# Patient Record
Sex: Female | Born: 2000 | Race: Black or African American | Hispanic: No | Marital: Single | Smoking: Never smoker
Health system: Southern US, Community
[De-identification: ages and names within clinical notes are randomized; demographics above are authoritative.]

## PROBLEM LIST (undated history)

## (undated) DIAGNOSIS — L709 Acne, unspecified: Secondary | ICD-10-CM

---
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
## 2020-01-19 ENCOUNTER — Other Ambulatory Visit: Payer: Self-pay | Admitting: Pediatrics

## 2020-01-19 DIAGNOSIS — N6310 Unspecified lump in the right breast, unspecified quadrant: Secondary | ICD-10-CM

## 2020-02-06 ENCOUNTER — Other Ambulatory Visit: Payer: No Typology Code available for payment source

## 2020-02-28 ENCOUNTER — Other Ambulatory Visit: Payer: Self-pay

## 2020-02-28 ENCOUNTER — Ambulatory Visit
Admission: RE | Admit: 2020-02-28 | Discharge: 2020-02-28 | Disposition: A | Discharge status: Home / Self Care | Payer: No Typology Code available for payment source | Source: Ambulatory Visit | Attending: Pediatrics | Admitting: Pediatrics

## 2020-02-28 DIAGNOSIS — N6310 Unspecified lump in the right breast, unspecified quadrant: Secondary | ICD-10-CM

## 2020-03-17 IMAGING — US ULTRASOUND RIGHT BREAST LIMITED
1 series · 13 of 14 positions shown · non-contrast
Comparison: Previous exam(s).

CLINICAL DATA: Follow-up for 3 probably benign fibroadenomas in the
LEFT breast. These masses were originally identified on LEFT breast
ultrasound dated 01/29/2008 (patient had 2 palpable abnormalities at
that time).

EXAM:
ULTRASOUND OF THE LEFT BREAST

[Series 1: ultrasound right breast limited · 0.07mm/px · 13 of 14 slices shown]
[im 1/14]
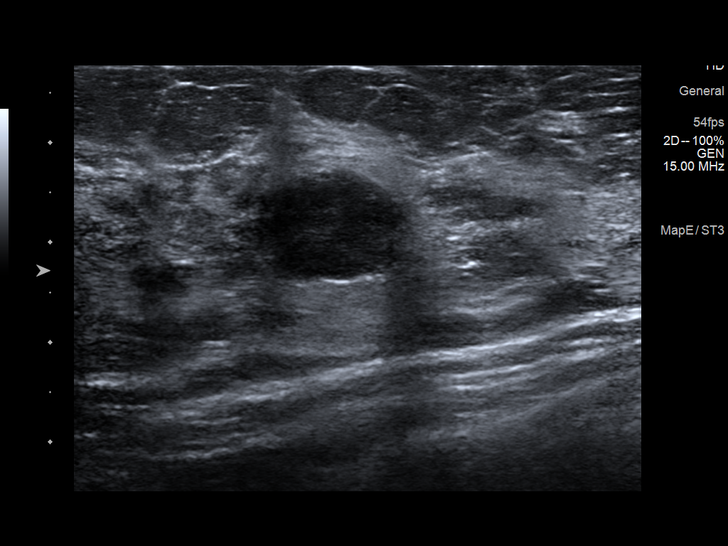
[im 2/14]
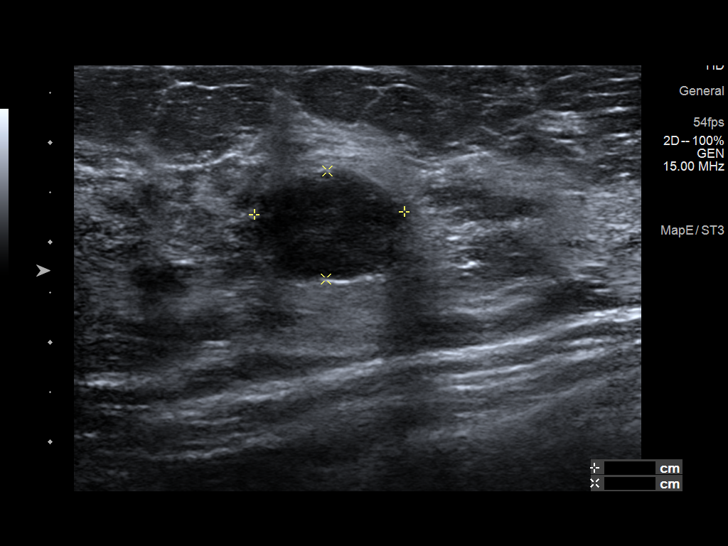
[im 3/14]
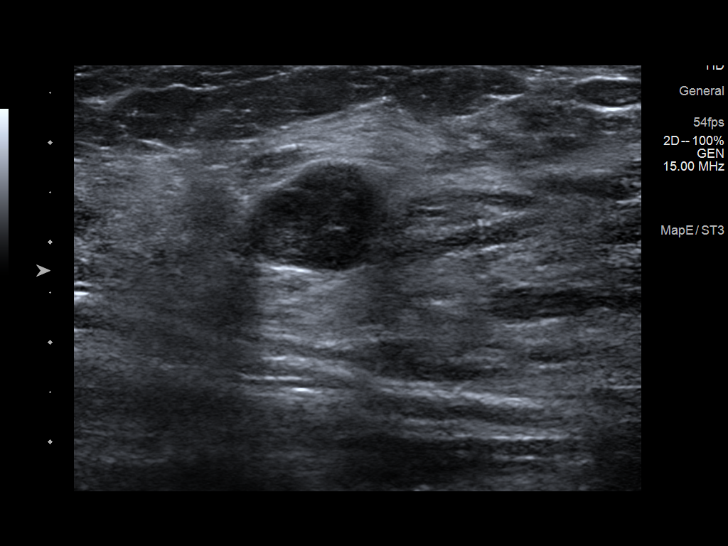
[im 4/14]
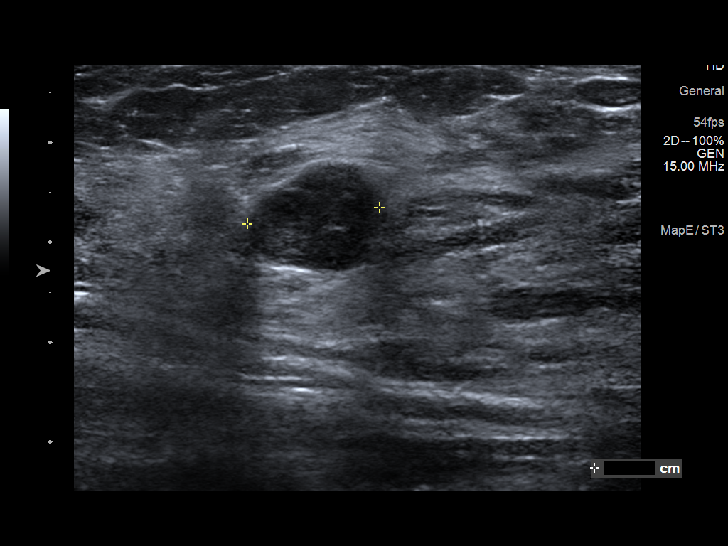
[im 5/14]
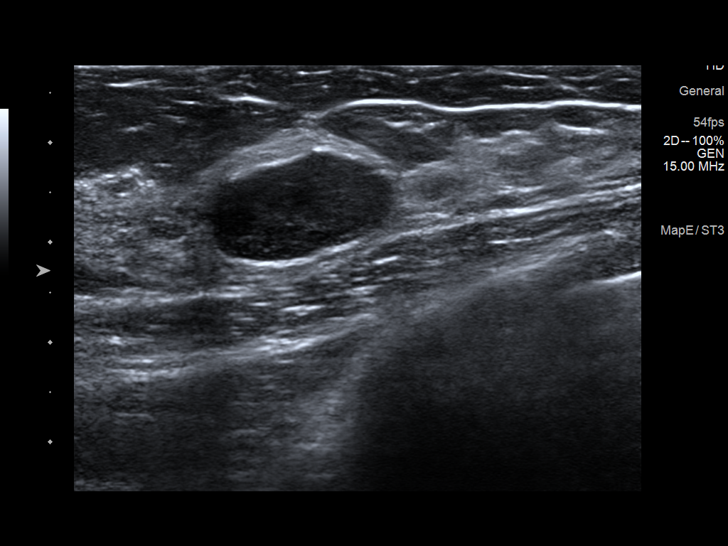
[im 6/14]
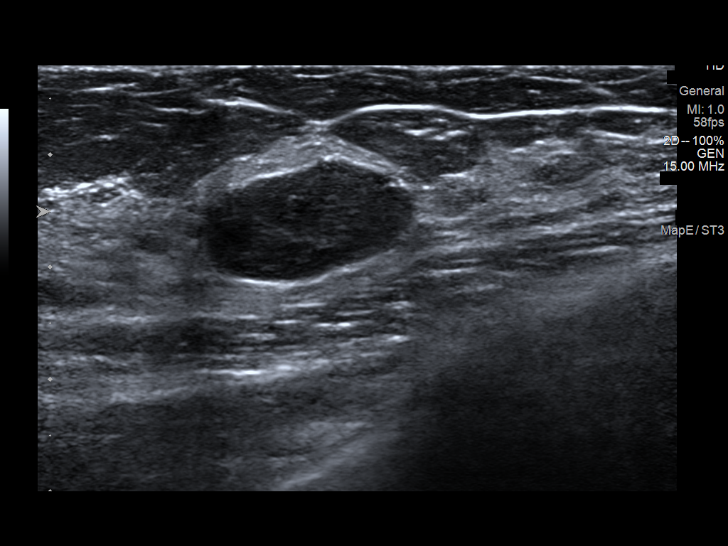
[im 8/14]
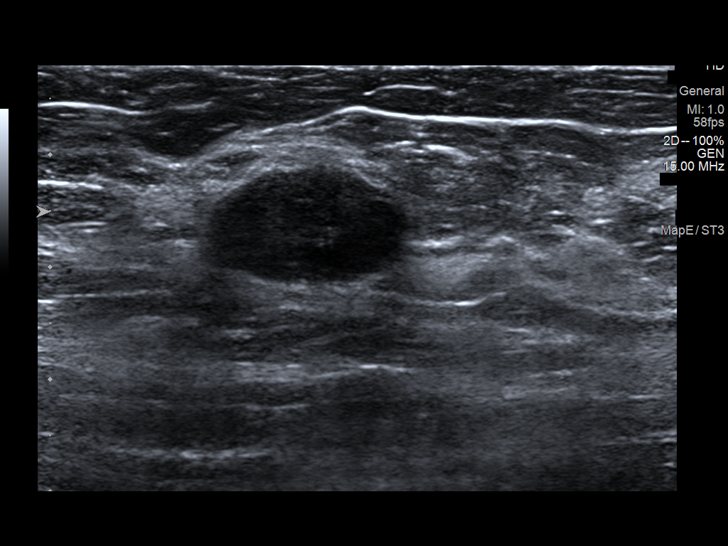
[im 9/14]
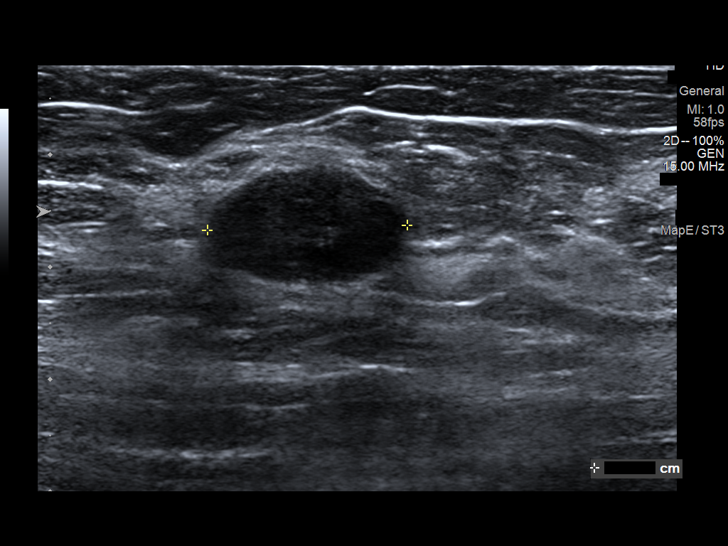
[im 10/14]
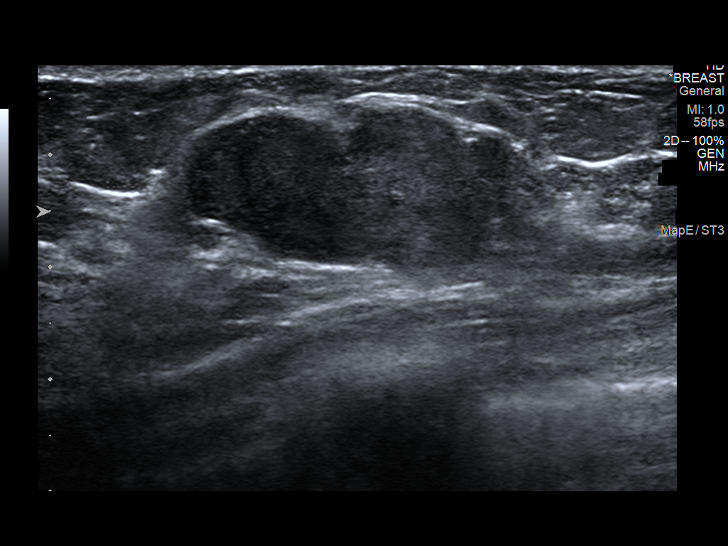
[im 11/14]
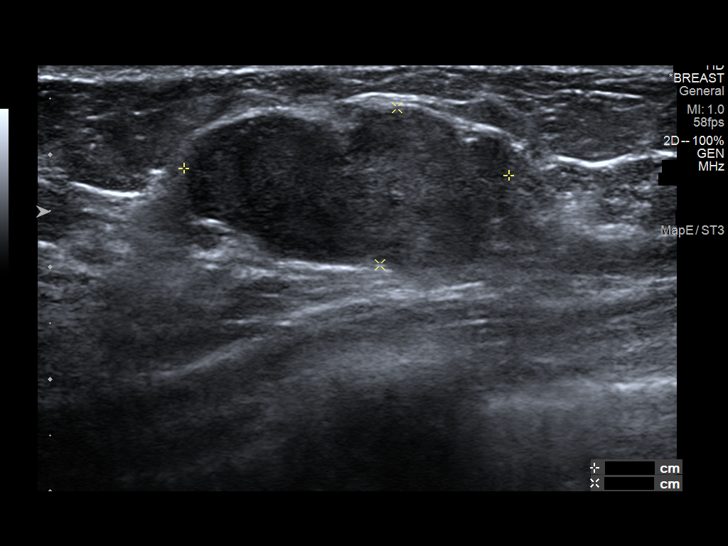
[im 12/14]
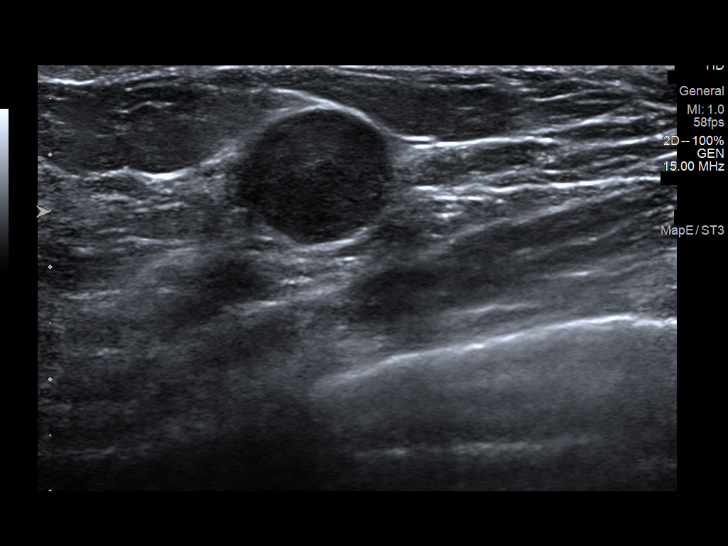
[im 13/14]
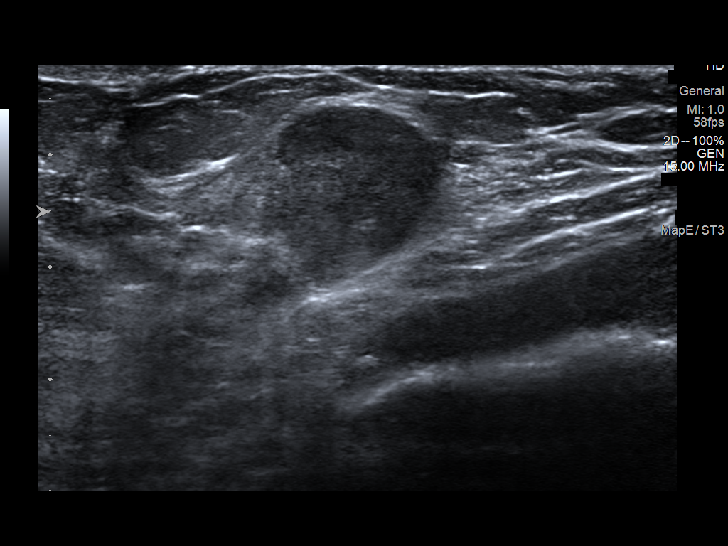
[im 14/14]
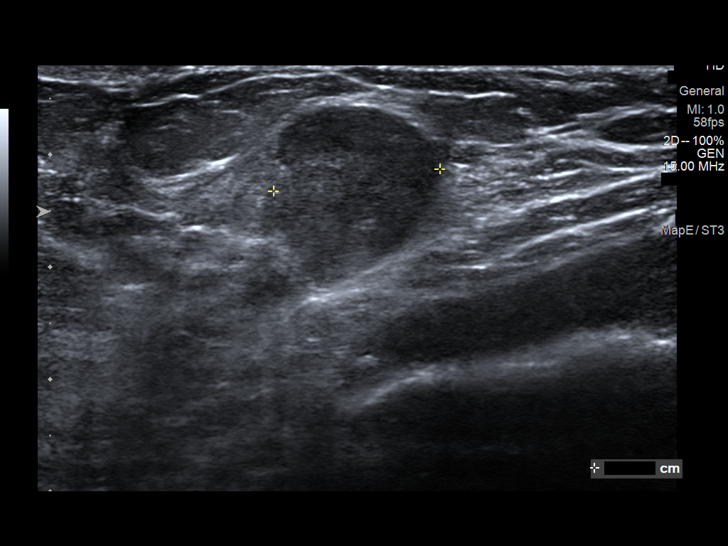

[13 of 14 positions shown; findings below may reference images not displayed]

FINDINGS: Targeted ultrasound is performed, showing 3 oval circumscribed
hypoechoic masses in the RIGHT breast as follows:

12 o'clock axis, 5 cm from the nipple, measuring 1.5 x 1.1 x 1.3 cm,
slightly smaller compared to previous ultrasound of 01/28/2018.

12:30 o'clock axis, 7 cm from the nipple, measuring 1.8 x 0.9 x
cm, not significantly changed.

2 o'clock axis, 7 cm from the nipple, measuring 2.9 x 1.4 x 1.5 cm,
minimally enlarged.
IMPRESSION: Three probably benign fibroadenomas within the RIGHT breast, as
detailed above. Recommend additional follow-up ultrasound in 6
months to ensure stability.

RECOMMENDATION:
RIGHT breast ultrasound in 3 months.

I have discussed the findings and recommendations with the patient.
Results were also provided in writing at the conclusion of the
visit. If applicable, a reminder letter will be sent to the patient
regarding the next appointment.

BI-RADS CATEGORY  3: Probably benign.

## 2020-08-29 ENCOUNTER — Ambulatory Visit: Payer: Medicaid Other | Attending: Internal Medicine

## 2020-08-29 DIAGNOSIS — Z23 Encounter for immunization: Secondary | ICD-10-CM

## 2020-08-29 NOTE — Progress Notes (Signed)
   Covid-19 Vaccination Clinic  Name:  Jillian Kelley    MRN: [id number] DOB: 03-Oct-2000  08/29/2020  [name] was observed post Covid-19 immunization for 15 minutes without incident. She was provided with Vaccine Information Sheet and instruction to access the V-Safe system.   [name] was instructed to call 911 with any severe reactions post vaccine: Marland Kitchen Difficulty breathing  . Swelling of face and throat  . A fast heartbeat  . A bad rash all over body  . Dizziness and weakness   Immunizations Administered    Name Date Dose VIS Date Route   Pfizer COVID-19 Vaccine 08/29/2020  2:15 PM 0.3 mL 06/19/2020 Intramuscular   Manufacturer: [employer], [employer]   Lot: [id number]   NDC: [address]-6

## 2020-09-16 IMAGING — US ULTRASOUND RIGHT BREAST LIMITED
1 series · 13 of 13 positions shown · non-contrast
Comparison: Previous exam(s).

CLINICAL DATA: Short-term follow-up for 3 masses in the right
breast, initially assessed on 01/28/2018.

EXAM:
ULTRASOUND OF THE RIGHT BREAST

[Series 1: ultrasound right breast limited · 0.07mm/px · 13 of 13 slices shown]
[im 1/13]
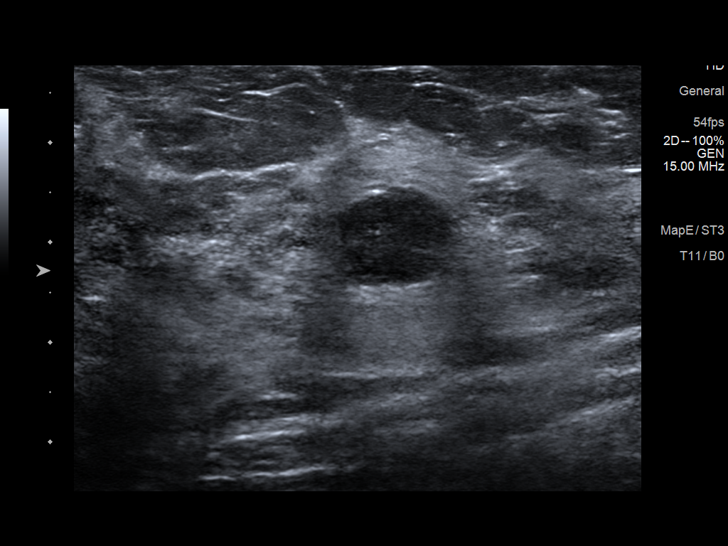
[im 2/13]
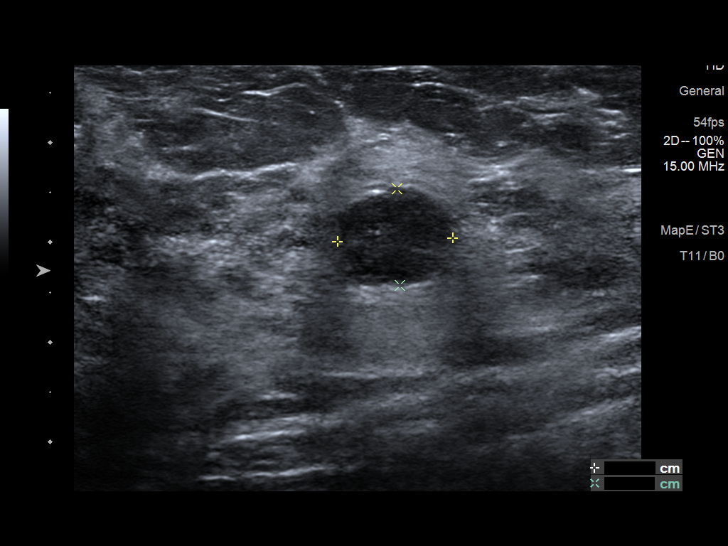
[im 3/13]
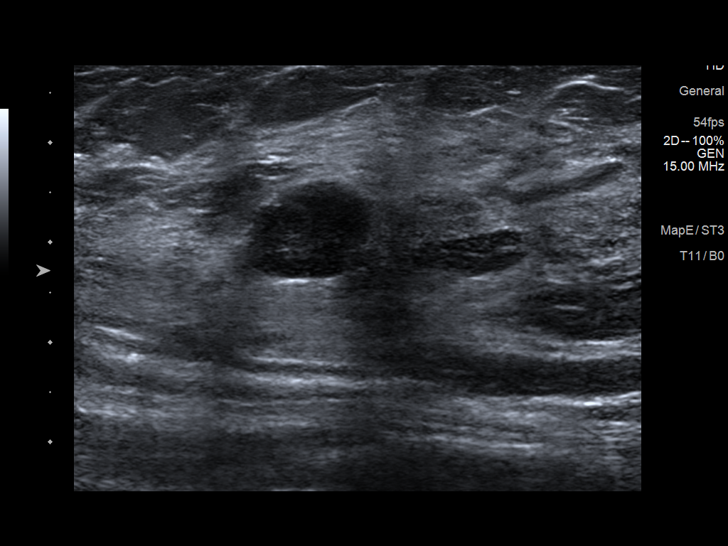
[im 4/13]
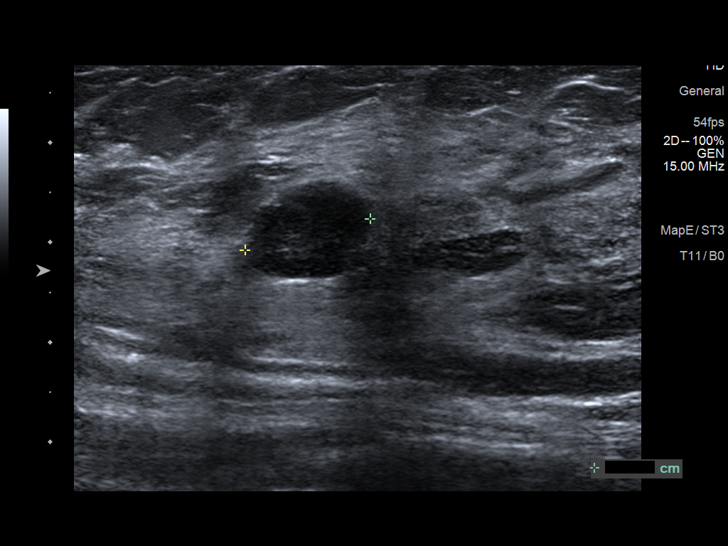
[im 5/13]
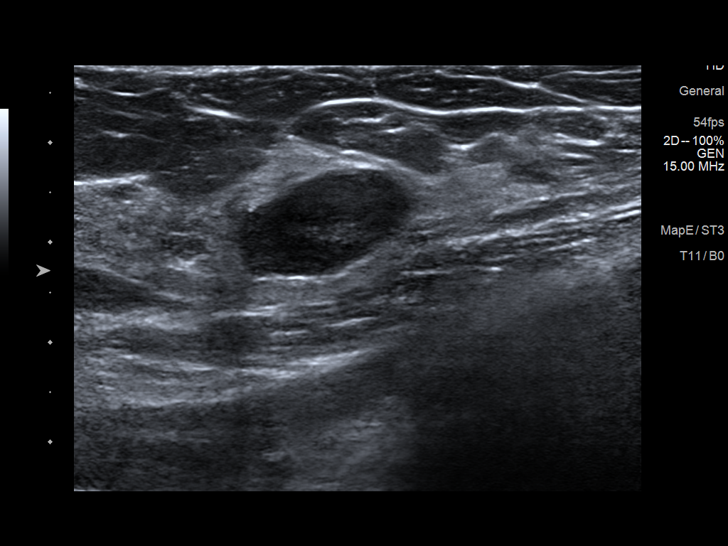
[im 6/13]
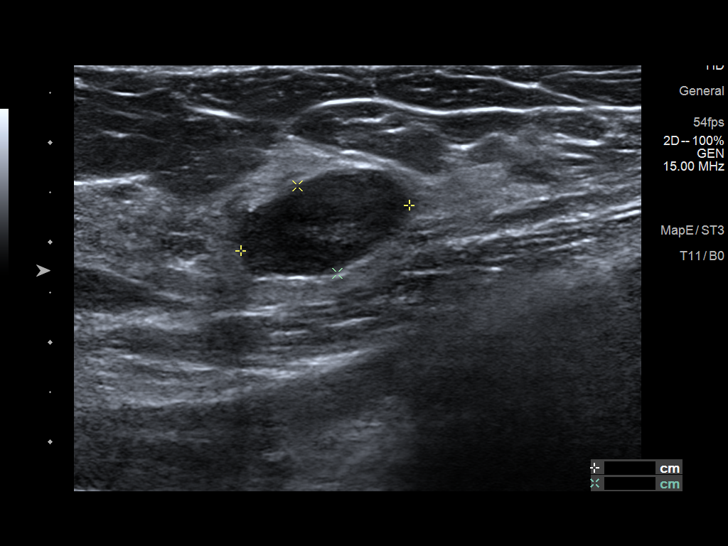
[im 7/13]
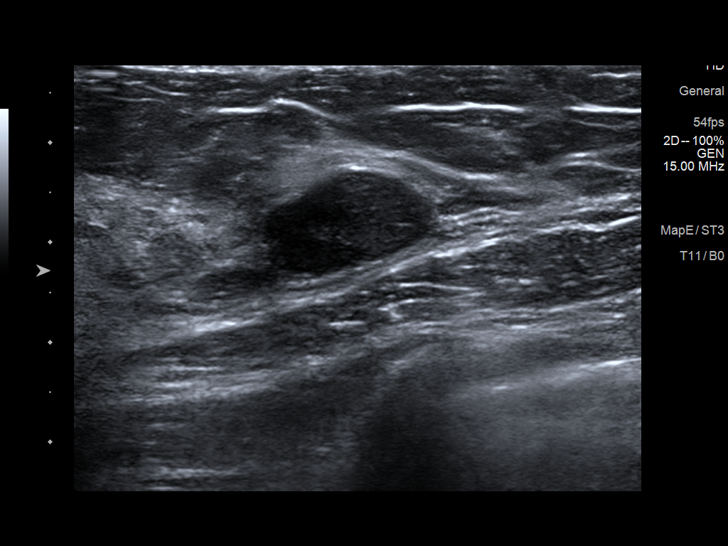
[im 8/13]
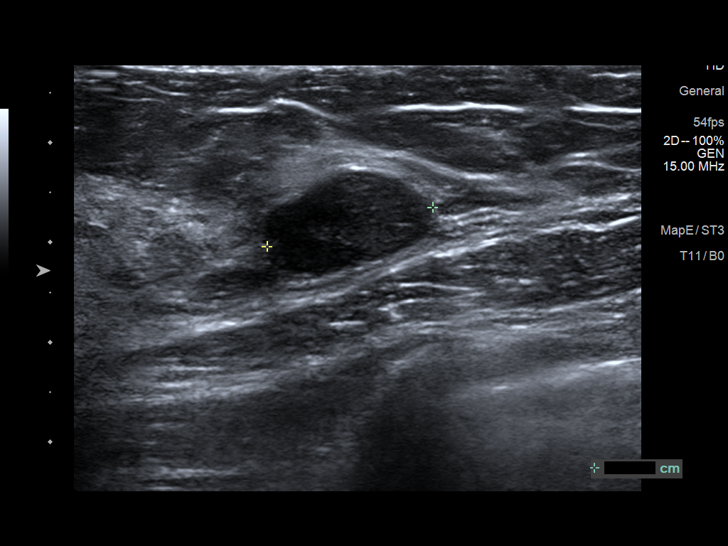
[im 9/13]
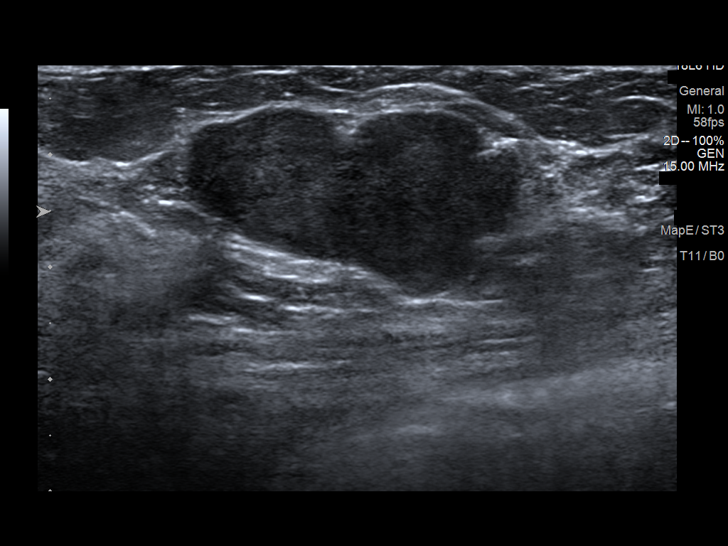
[im 10/13]
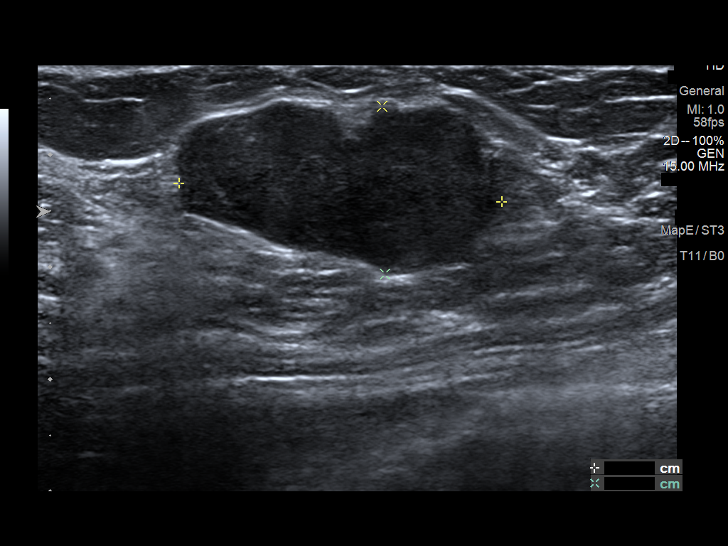
[im 11/13]
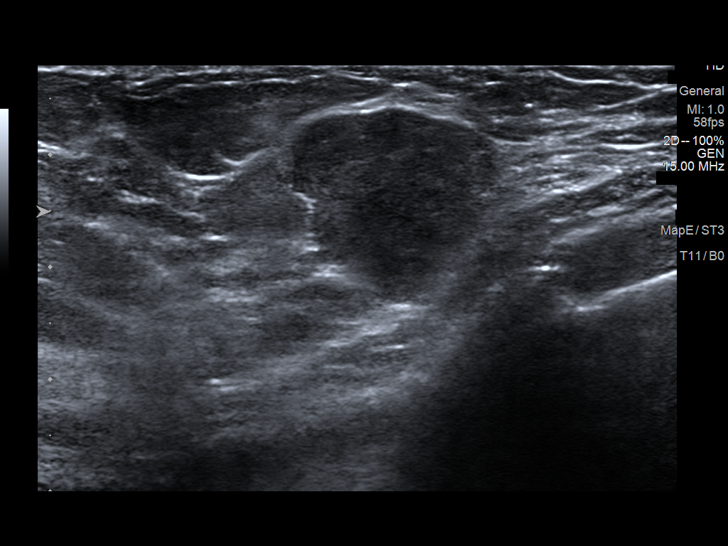
[im 12/13]
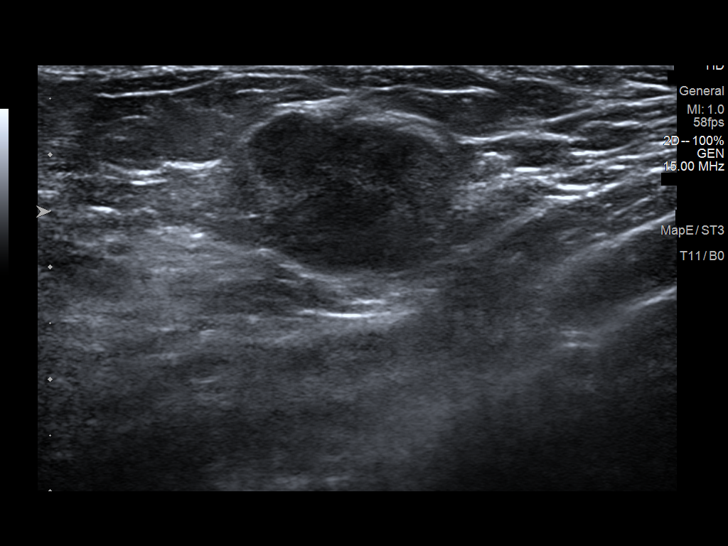
[im 13/13]
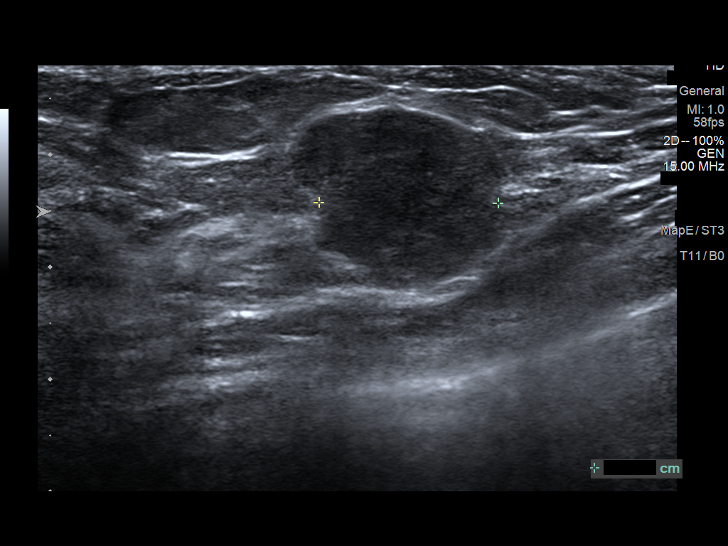

[13 of 13 positions shown; findings below may reference images not displayed]

FINDINGS: Targeted ultrasound is performed, showing a mass at 2 o'clock, 7 cm
the nipple, measuring 2.9 x 1.5 x 1.6 cm, a mass at 12:30 o'clock, 7
cm the nipple, measuring 1.8 x 1.0 x 1.7 cm and a mass at 12
o'clock, 5 cm the nipple, measuring 1.3 x 1.0 x 1.2 cm. The mass at
where it measured 1.7 x 1.2 x 1.5 cm. There has been no other
significant change.
IMPRESSION: 1. Probably benign masses in the right breast, most likely all
fibroadenomas. There are 3 discrete masses, 1 at 12 o'clock, 5 cm
from the nipple showing a mild decrease in size compared to the
initial exam. There is been no other change. Recommend 1 additional
follow-up in 1 year to document 2 years of stability.

RECOMMENDATION:
Right breast ultrasound in 1 year to document 2 years of stability
for the 3 probably benign right breast masses.

I have discussed the findings and recommendations with the patient.
Results were also provided in writing at the conclusion of the
visit. If applicable, a reminder letter will be sent to the patient
regarding the next appointment.

BI-RADS CATEGORY  3: Probably benign.

## 2020-09-19 ENCOUNTER — Ambulatory Visit: Payer: Medicaid Other | Attending: Internal Medicine

## 2020-09-19 DIAGNOSIS — Z23 Encounter for immunization: Secondary | ICD-10-CM

## 2020-09-19 NOTE — Progress Notes (Signed)
   Covid-19 Vaccination Clinic  Name:  Jillian Kelley    MRN: [id number] DOB: 2001/06/16  09/19/2020  [name] was observed post Covid-19 immunization for 15 minutes without incident. She was provided with Vaccine Information Sheet and instruction to access the V-Safe system.   [name] was instructed to call 911 with any severe reactions post vaccine: Marland Kitchen Difficulty breathing  . Swelling of face and throat  . A fast heartbeat  . A bad rash all over body  . Dizziness and weakness   Immunizations Administered    Name Date Dose VIS Date Route   Pfizer COVID-19 Vaccine 09/19/2020  3:51 PM 0.3 mL 06/19/2020 Intramuscular   Manufacturer: [employer]   Lot: [id number]   NDC: [address]-0

## 2021-03-20 ENCOUNTER — Ambulatory Visit
Admission: RE | Admit: 2021-03-20 | Discharge: 2021-03-20 | Disposition: A | Discharge status: Home / Self Care | Payer: No Typology Code available for payment source | Source: Ambulatory Visit | Attending: Obstetrics and Gynecology | Admitting: Obstetrics and Gynecology

## 2021-03-20 ENCOUNTER — Other Ambulatory Visit: Payer: Self-pay | Admitting: Obstetrics and Gynecology

## 2021-03-20 DIAGNOSIS — R7611 Nonspecific reaction to tuberculin skin test without active tuberculosis: Secondary | ICD-10-CM

## 2021-04-28 ENCOUNTER — Ambulatory Visit: Payer: Medicaid Other | Admitting: Nurse Practitioner

## 2021-06-14 ENCOUNTER — Emergency Department (HOSPITAL_BASED_OUTPATIENT_CLINIC_OR_DEPARTMENT_OTHER)
Admission: EM | Admit: 2021-06-14 | Discharge: 2021-06-14 | Disposition: A | Discharge status: Home / Self Care | Payer: 59 | Attending: Emergency Medicine | Admitting: Emergency Medicine

## 2021-06-14 ENCOUNTER — Other Ambulatory Visit: Payer: Self-pay

## 2021-06-14 ENCOUNTER — Encounter (HOSPITAL_BASED_OUTPATIENT_CLINIC_OR_DEPARTMENT_OTHER): Payer: Self-pay | Admitting: Emergency Medicine

## 2021-06-14 DIAGNOSIS — Y990 Civilian activity done for income or pay: Secondary | ICD-10-CM | POA: Diagnosis not present

## 2021-06-14 DIAGNOSIS — S61213A Laceration without foreign body of left middle finger without damage to nail, initial encounter: Secondary | ICD-10-CM | POA: Insufficient documentation

## 2021-06-14 DIAGNOSIS — W272XXA Contact with scissors, initial encounter: Secondary | ICD-10-CM | POA: Diagnosis not present

## 2021-06-14 DIAGNOSIS — S6992XA Unspecified injury of left wrist, hand and finger(s), initial encounter: Secondary | ICD-10-CM | POA: Diagnosis present

## 2021-06-14 HISTORY — DX: Acne, unspecified: L70.9

## 2021-06-14 MED ORDER — LIDOCAINE-EPINEPHRINE (PF) 2 %-1:200000 IJ SOLN
INTRAMUSCULAR | Status: AC
  Administered 2021-06-14: 20 mL
  Filled 2021-06-14: qty 20

## 2021-06-14 NOTE — ED Triage Notes (Signed)
Reports she cut her left middle finger with scissors while taking her hair out.  Reports tetanus is up to date.  Bleeding controlled in triage.

## 2021-06-14 NOTE — Discharge Instructions (Addendum)
Have the sutures removed in 7 to 10 days.  You may return here or see your primary care physician or an urgent care.

## 2021-06-14 NOTE — ED Provider Notes (Signed)
[facility] Provider Note: Georgena Spurling, MD, FACEP  CSN: [id number] MRN: [id number] ARRIVAL: 06/14/21 at 2205 ROOM: [facility]13/[facility]13   CHIEF COMPLAINT  Laceration   HISTORY OF PRESENT ILLNESS  06/14/21 10:51 PM Jillian Kelley is a 20 y.o. female who cut the volar surface of her left hand, overlying the middle finger MCP joint, about 8:30 PM.  She did this while working on her hair with a pair of scissors.  There is a small laceration at the site.  There is negligible pain.  Bleeding has been controlled with pressure.  There is no functional sensory deficit associated.  Her tetanus is up-to-date.   Past Medical History:  Diagnosis Date   Acne     History reviewed. No pertinent surgical history.  Family History  Problem Relation Age of Onset   Breast cancer Maternal Grandmother 41   Liver cancer Father     Social History   Tobacco Use   Smoking status: Never   Smokeless tobacco: Never  Substance Use Topics   Alcohol use: No   Drug use: No    Prior to Admission medications   Not on File    Allergies Patient has no known allergies.   REVIEW OF SYSTEMS  Negative except as noted here or in the History of Present Illness.   PHYSICAL EXAMINATION  Initial Vital Signs Blood pressure 135/88, pulse 75, temperature 98.1 F (36.7 C), temperature source Oral, resp. rate 18, height 5\' 4"  (1.626 m), weight 75.8 kg, last menstrual period 05/30/2021, SpO2 99 %.  Examination General: Well-developed, well-nourished female in no acute distress; appearance consistent with age of record HENT: normocephalic; atraumatic Eyes: Normal appearance Neck: supple Heart: regular rate and rhythm Lungs: Normal respiratory effort and excursion Abdomen: soft; nondistended Extremities: No deformity; normal range of motion Neurologic: Awake, alert and oriented; motor function intact in all extremities and symmetric; no facial droop Skin: Warm and dry; superficial laceration to skin  of the volar surface of the left hand overlying the third MCP joint Psychiatric: Normal mood and affect   RESULTS  Summary of this visit's results, reviewed and interpreted by myself:   EKG Interpretation  Date/Time:    Ventricular Rate:    PR Interval:    QRS Duration:   QT Interval:    QTC Calculation:   R Axis:     Text Interpretation:         Laboratory Studies: No results found for this or any previous visit (from the past 24 hour(s)). Imaging Studies: No results found.  ED COURSE and MDM  Nursing notes, initial and subsequent vitals signs, including pulse oximetry, reviewed and interpreted by myself.  Vitals:   06/14/21 2214 06/14/21 2214  BP:  135/88  Pulse:  75  Resp:  18  Temp:  98.1 F (36.7 C)  TempSrc:  Oral  SpO2:  99%  Weight: 75.8 kg   Height: 5\' 4"  (1.626 m)    Medications  lidocaine-EPINEPHrine (XYLOCAINE W/EPI) 2 %-1:200000 (PF) injection (has no administration in time range)    Wound closed as described below.  The wound is superficial and clean and I do not believe antibiotics are warranted at this time.  Patient was advised to have the stitches removed in 7 to 10 days.  PROCEDURES  Procedures LACERATION REPAIR Performed by: Karen Chafe Kenidy Crossland Authorized by: Karen Chafe Cimberly Stoffel Consent: Verbal consent obtained. Risks and benefits: risks, benefits and alternatives were discussed Consent given by: patient Patient identity confirmed: provided demographic data  Prepped and Draped in normal sterile fashion Wound explored  Laceration Location: Left hand  Laceration Length: 0.8 cm  No Foreign Bodies seen or palpated  Anesthesia: local infiltration  Local anesthetic: lidocaine 2% with epinephrine  Anesthetic total: 0.5 ml  Irrigation method: syringe Amount of cleaning: standard  Skin closure: 4-0 Prolene  Number of sutures: 2  Technique: Simple interrupted  Patient tolerance: Patient tolerated the procedure well with no immediate  complications.    ED DIAGNOSES     ICD-10-CM   1. Laceration of left middle finger without foreign body without damage to nail, initial encounter  R30.856D          Shanon Rosser, MD 06/14/21 2318

## 2021-06-14 NOTE — ED Notes (Signed)
Pt NAD, a/ox4. Pt verbalizes understanding of all DC and f/u instructions. All questions answered. Pt walks with steady gait to lobby at DC.  ? ?

## 2021-10-19 IMAGING — US US BREAST*R* LIMITED INC AXILLA
1 series · 13 of 13 positions shown · non-contrast
Comparison: Previous exam(s).

CLINICAL DATA: 19-year-old female presenting for final 2 year
follow-up of 3 right breast masses.

EXAM:
ULTRASOUND OF THE RIGHT BREAST

[Series 1: us breast*right* limited inc axilla · 0.07mm/px · 13 of 13 slices shown]
[im 1/13]
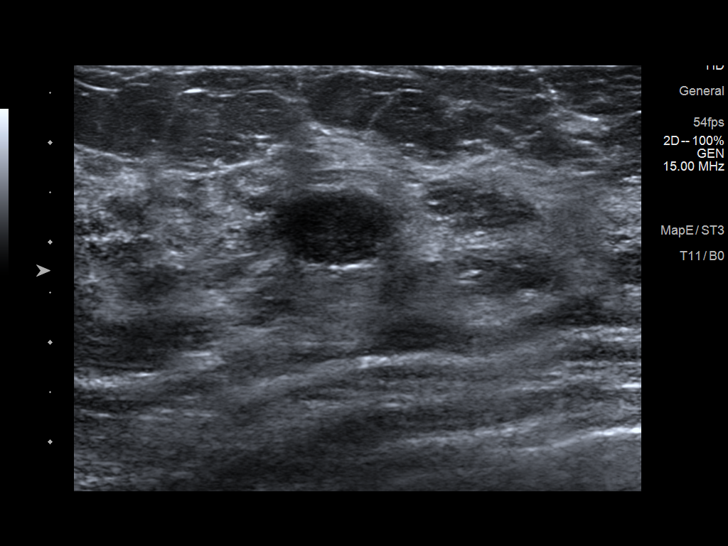
[im 2/13]
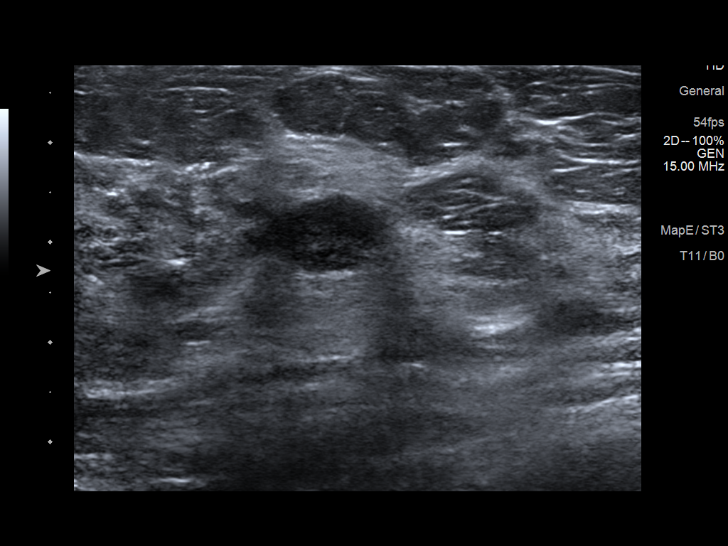
[im 3/13]
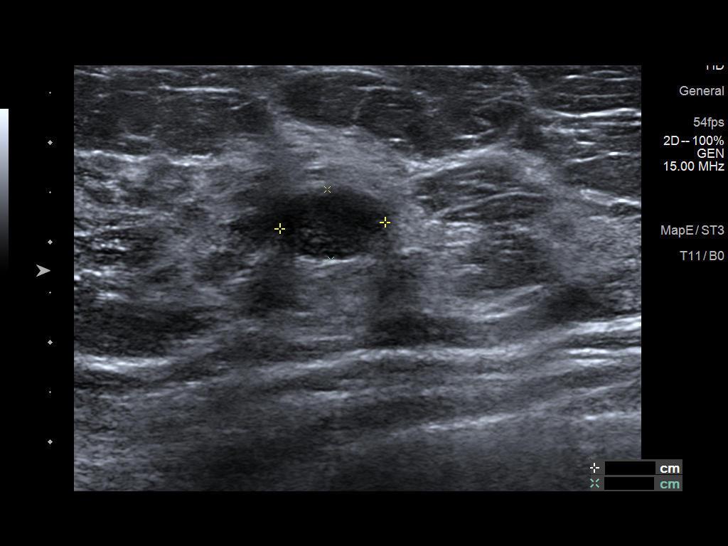
[im 4/13]
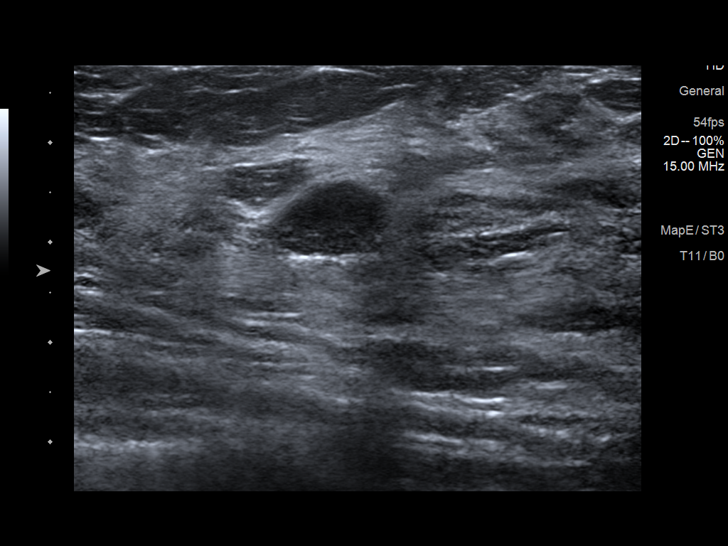
[im 5/13]
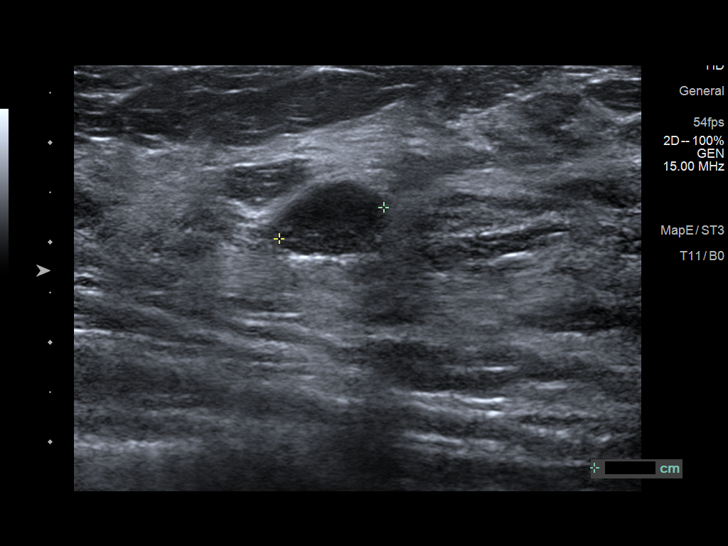
[im 6/13]
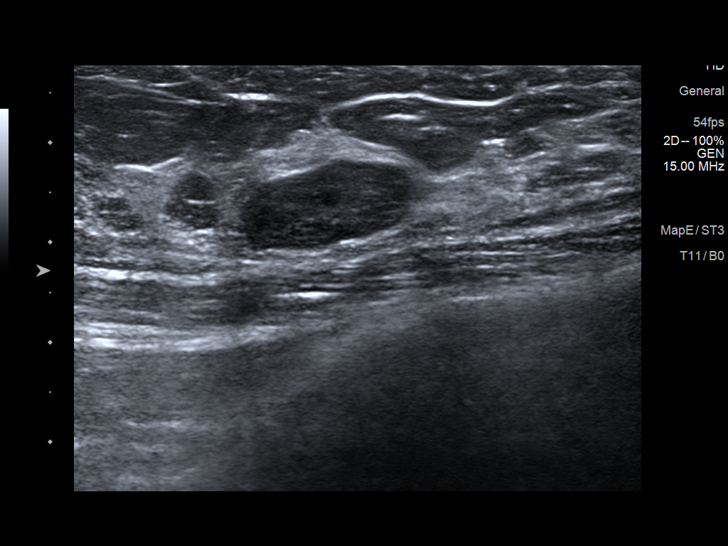
[im 7/13]
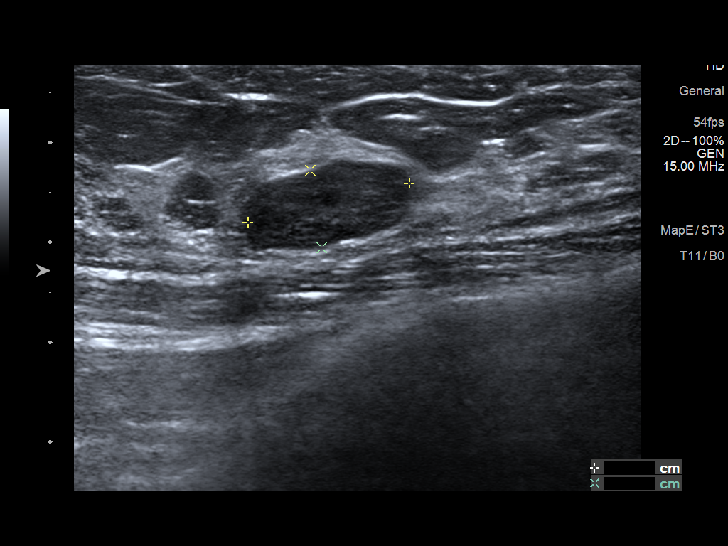
[im 8/13]
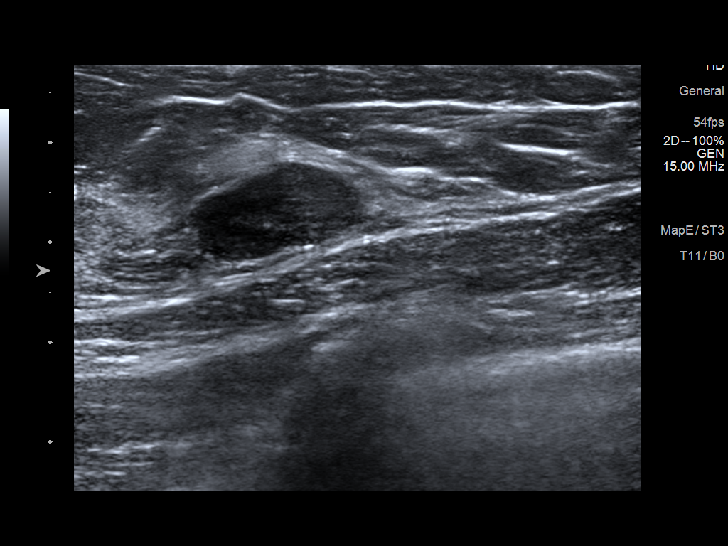
[im 9/13]
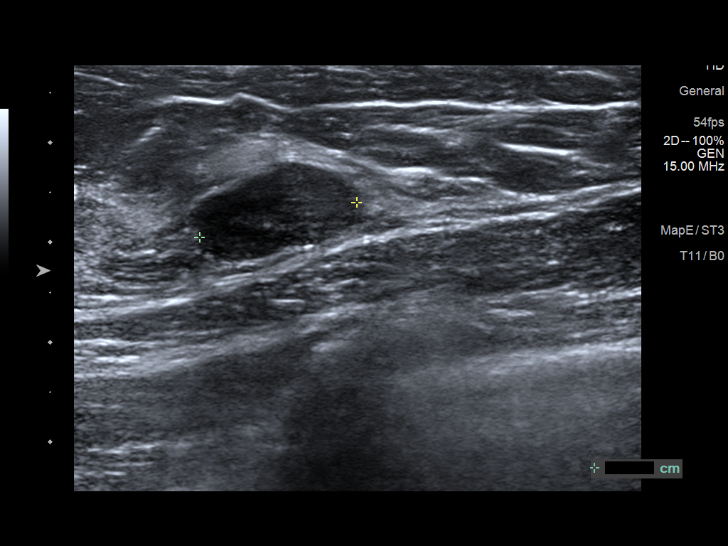
[im 10/13]
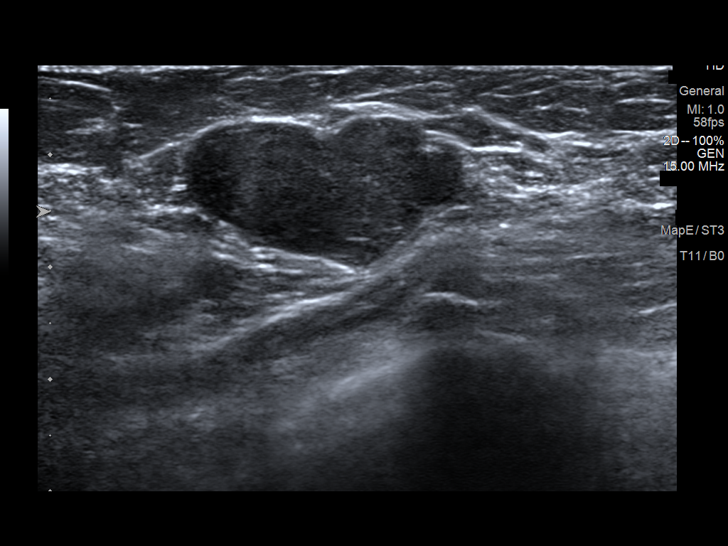
[im 11/13]
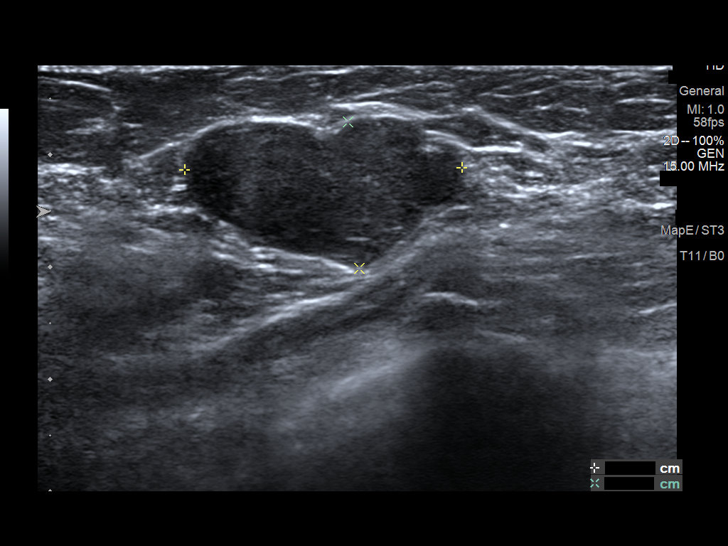
[im 12/13]
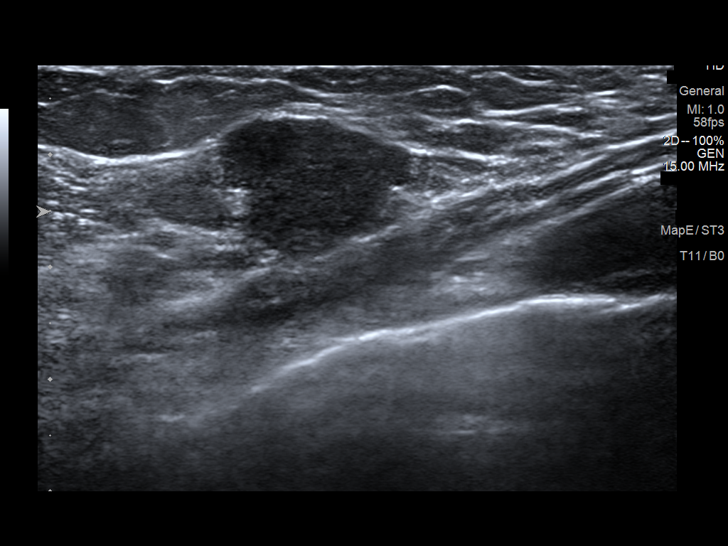
[im 13/13]
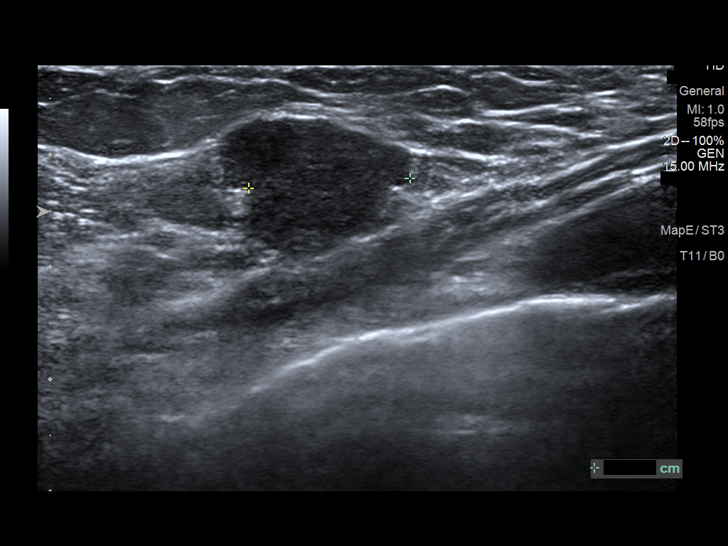

[13 of 13 positions shown; findings below may reference images not displayed]

FINDINGS: Targeted ultrasound is performed, showing stable to slight interval
decrease in size of a 3 oval, circumscribed hypoechoic masses in the
right breast. This includes a 1.1 x 1.1 x 0.7 cm mass at the 12
o'clock position 5 cm from the nipple (previously 1.7 x 1.5 x
cm), a 1.7 x 1.6 x 1.8 cm mass at the [DATE] position 7 cm from the
nipple (previously 2.0 x 1.9 x 1.0 cm), and a 2.5 x 1.4 x 1.3 cm
mass at the 2 o'clock position 7 cm from the nipple (previously
x 1.4 x 1.2 cm).
IMPRESSION: Three benign right breast masses. No further imaging follow-up
required.

RECOMMENDATION:
Screening mammogram at age 40 unless there are persistent or
intervening clinical concerns. (Code:[id number])

I have discussed the findings and recommendations with the patient.
If applicable, a reminder letter will be sent to the patient
regarding the next appointment.

BI-RADS CATEGORY  2: Benign.

## 2022-02-09 ENCOUNTER — Encounter: Payer: Self-pay | Admitting: Family

## 2022-02-09 ENCOUNTER — Ambulatory Visit (INDEPENDENT_AMBULATORY_CARE_PROVIDER_SITE_OTHER): Payer: 59 | Admitting: Family

## 2022-02-09 VITALS — BP 116/68 | HR 72 | Temp 98.6°F | Resp 16 | Ht 64.0 in | Wt 176.0 lb

## 2022-02-09 DIAGNOSIS — L709 Acne, unspecified: Secondary | ICD-10-CM | POA: Diagnosis not present

## 2022-02-09 DIAGNOSIS — Z0001 Encounter for general adult medical examination with abnormal findings: Secondary | ICD-10-CM | POA: Diagnosis not present

## 2022-02-09 DIAGNOSIS — E663 Overweight: Secondary | ICD-10-CM

## 2022-02-09 DIAGNOSIS — Z23 Encounter for immunization: Secondary | ICD-10-CM | POA: Diagnosis not present

## 2022-02-09 DIAGNOSIS — Z0000 Encounter for general adult medical examination without abnormal findings: Secondary | ICD-10-CM | POA: Insufficient documentation

## 2022-02-09 LAB — LIPID PANEL
Cholesterol: 151 mg/dL (ref 0–200)
HDL: 54.1 mg/dL (ref >39.00)
LDL Cholesterol: 90 mg/dL (ref 0–99)
NonHDL: 97.26
Total CHOL/HDL Ratio: 3
Triglycerides: 37 mg/dL (ref 0.0–149.0)
VLDL: 7.4 mg/dL (ref 0.0–40.0)

## 2022-02-09 MED ORDER — CLINDAMYCIN PHOS-BENZOYL PEROX 1-5 % EX GEL: Freq: Two times a day (BID) | CUTANEOUS | 5 refills | Status: DC

## 2022-02-09 NOTE — Assessment & Plan Note (Signed)
Mild acne on forehead. Rx sent for benzaclin. She will send me details of the retinol cream that she was using.

## 2022-02-09 NOTE — Assessment & Plan Note (Addendum)
Encouraged covid bivalent booster. Tdap today. She declines pap smear and wishes to schedule another day. We did discuss diet/exercise and weight loss.

## 2022-02-09 NOTE — Progress Notes (Addendum)
Subjective:   By signing my name below, I, Shehryar Baig, attest that this documentation has been prepared under the direction and in the presence of Debbrah Alar, NP 02/09/2022     Patient ID: Jillian Kelley, female    DOB: 03/16/2001, 21 y.o.   MRN: [id number]  No chief complaint on file.   HPI Patient is in today for a new patient visit.   Menstrual cycle- Her menstrual cycle is regular and typically last for 7 days. She reports her cramping was bad earlier in her life but now she just experiences discomfort.  Acne- She is managing her acne well at this time. She has taken retinol and benzaclin in the past and is interested in starting them again.   Social history: She is working a this time. 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 She is currently living with her mother and 2 brothers. She has a history of acne, 3 benign breast masses, otherwise she has no other medical history. She has no past surgical history. Her father passed away from liver cancer at age 75. Her father has a history of gallbladder removal. Her maternal grandmother has a history of breast cancer. She has no known family member with diabetes. She has 1 drink of alcohol weekly. She does not use drugs. She is not sexually active. She does not use tobacco or vaping products.  She denies unexpected weight gain, adenopathy, Fever, New moles, congestion, sinus pain, sore throat, chest pain, palpitaitons, leg swelling, shortness of breath, wheezing, cough Nausea, vomiting, diarrhea, consitpation, blood in stool, dysuria, hematuria, new joint pain, new muscle pain, frequent headaches, dizziness, depression, or anxiety.  Immunizations: She is due for a tetanus vaccine and is interested in receiving it. She has Covid-19 vaccines.  Diet: She is managing a healthy diet.  Exercise: She participates in exercise 3x every week by walking, StairMaster, and using the elliptical.  Pap Smear: She is  interested in scheduling an appointment at a later date.    Health Maintenance Due  Topic Date Due  . HPV VACCINES (1 - 2-dose series) Never done  . HIV Screening  Never done  . Hepatitis C Screening  Never done  . COVID-19 Vaccine (3 - Pfizer series) 11/14/2020  . PAP-Cervical Cytology Screening  01/31/2022  . PAP SMEAR-Modifier  01/31/2022    Past Medical History:  Diagnosis Date  . Acne     History reviewed. No pertinent surgical history.  Family History  Problem Relation Age of Onset  . Liver cancer Father        died at 58  . Alcohol abuse Father   . Breast cancer Maternal Grandmother 75    Social History   Socioeconomic History  . Marital status: Single    Spouse name: Not on file  . Number of children: Not on file  . Years of education: Not on file  . Highest education level: Not on file  Occupational History  . Not on file  Tobacco Use  . Smoking status: Never  . Smokeless tobacco: Never  Vaping Use  . Vaping Use: Never used  Substance and Sexual Activity  . Alcohol use: Yes    Alcohol/week: 1.0 standard drink of alcohol    Types: 1 Shots of liquor per week    Comment: per week  . Drug use: No  . Sexual activity: Never    Birth control/protection: None  Other Topics Concern  . 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     Social Determinants of Health   Financial Resource Strain: Not on file  Food Insecurity: No Food Insecurity (02/09/2022)   Hunger Vital Sign   . Worried About Charity fundraiser in the Last Year: Never true   . Ran Out of Food in the Last Year: Never true  Transportation Needs: No Transportation Needs (02/09/2022)   PRAPARE - Transportation   . Lack of Transportation (Medical): No   . Lack of Transportation (Non-Medical): No  Physical Activity: Insufficiently  Active (02/09/2022)   Exercise Vital Sign   . Days of Exercise per Week: 2 days   . Minutes of Exercise per Session: 60 min  Stress: Not on file  Social Connections: Not on file  Intimate Partner Violence: Not on file    No outpatient medications prior to visit.   No facility-administered medications prior to visit.    No Known Allergies  Review of Systems  Constitutional:  Negative for fever.       (-)unexpected weight change (-)Adenopathy  HENT:  Negative for congestion, hearing loss, sinus pain and sore throat.        (-)Rhinorrhea   Eyes:        (-)Visual disturbance  Respiratory:  Negative for cough, shortness of breath and wheezing.   Cardiovascular:  Negative for chest pain, palpitations and leg swelling.  Gastrointestinal:  Negative for blood in stool, constipation, diarrhea, nausea and vomiting.  Genitourinary:  Negative for dysuria, frequency and hematuria.  Musculoskeletal:  Negative for joint pain.       (-)new muscle pain  Skin:        (-)new moles  Neurological:  Negative for headaches.  Psychiatric/Behavioral:  Negative for depression. The patient is not nervous/anxious.        Objective:    Physical Exam Constitutional:      General: She is not in acute distress.    Appearance: Normal appearance. She is not ill-appearing.  HENT:     Head: Normocephalic and atraumatic.     Right Ear: Tympanic membrane, ear canal and external ear normal.     Left Ear: Tympanic membrane, ear canal and external ear normal.  Eyes:     Extraocular Movements: Extraocular movements intact.     Right eye: No nystagmus.     Left eye: No nystagmus.     Pupils: Pupils are equal, round, and reactive to light.  Neck:     Thyroid: No thyroid tenderness.  Cardiovascular:     Rate and Rhythm: Normal rate and regular rhythm.     Heart sounds: Normal heart sounds. No murmur heard.    No gallop.  Pulmonary:     Effort: Pulmonary effort is normal. No respiratory distress.      Breath sounds: Normal breath sounds. No wheezing or rales.  Abdominal:     General: There is no distension.     Palpations: Abdomen is soft.     Tenderness: There is no abdominal tenderness. There is no guarding.  Musculoskeletal:     Comments: 5/5 strength in both upper and lower extremities  Lymphadenopathy:     Cervical: No cervical adenopathy.  Skin:    General: Skin is warm and dry.  Neurological:     Mental Status:  She is alert and oriented to person, place, and time.     Deep Tendon Reflexes:     Reflex Scores:      Patellar reflexes are 2+ on the right side and 2+ on the left side. Psychiatric:        Judgment: Judgment normal.    BP 116/68 (BP Location: Right Arm, Patient Position: Sitting, Cuff Size: Small)   Pulse 72   Temp 98.6 F (37 C) (Oral)   Resp 16   Ht '5\' 4"'$  (1.626 m)   Wt 176 lb (79.8 kg)   SpO2 100%   BMI 30.21 kg/m  Wt Readings from Last 3 Encounters:  02/09/22 176 lb (79.8 kg)  06/14/21 167 lb (75.8 kg)  04/04/18 165 lb (74.8 kg) (92 %, Z= 1.44)*   * Growth percentiles are based on CDC (Girls, 2-20 Years) data.       Assessment & Plan:   Problem List Items Addressed This Visit       Unprioritized   Encounter for preventive care - Primary    Encouraged covid bivalent booster. Tdap today. She declines pap smear and wishes to schedule another day. We did discuss diet/exercise and weight loss.       Acne    Mild acne on forehead. Rx sent for benzaclin. She will send me details of the retinol cream that she was using.       Relevant Medications   clindamycin-benzoyl peroxide (BENZACLIN) gel   Other Visit Diagnoses     Overweight       Relevant Orders   Lipid panel   Need for Tdap vaccination       Relevant Orders   Tdap vaccine greater than or equal to 7yo IM (Completed)        Meds ordered this encounter  Medications  . clindamycin-benzoyl peroxide (BENZACLIN) gel    Sig: Apply topically 2 (two) times daily.    Dispense:  25  g    Refill:  5    Order Specific Question:   Supervising Provider    Answer:   Penni Homans A [[id number]]    I, Nance Pear, NP, personally preformed the services described in this documentation.  All medical record entries made by the scribe were at my direction and in my presence.  I have reviewed the chart and discharge instructions (if applicable) and agree that the record reflects my personal performance and is accurate and complete. 02/09/2022   I,Shehryar Baig,acting as a Education administrator for Nance Pear, NP.,have documented all relevant documentation on the behalf of Nance Pear, NP,as directed by  Nance Pear, NP while in the presence of Nance Pear, NP.   Nance Pear, NP

## 2022-03-10 ENCOUNTER — Encounter: Payer: Self-pay | Admitting: Family

## 2022-03-10 ENCOUNTER — Other Ambulatory Visit (HOSPITAL_COMMUNITY)
Admission: RE | Admit: 2022-03-10 | Discharge: 2022-03-10 | Disposition: A | Discharge status: Home / Self Care | Payer: 59 | Source: Ambulatory Visit | Attending: Family | Admitting: Family

## 2022-03-10 ENCOUNTER — Ambulatory Visit (INDEPENDENT_AMBULATORY_CARE_PROVIDER_SITE_OTHER): Payer: 59 | Admitting: Family

## 2022-03-10 VITALS — BP 110/78 | HR 75 | Temp 99.0°F | Resp 16 | Ht 64.0 in | Wt 175.0 lb

## 2022-03-10 DIAGNOSIS — Z01419 Encounter for gynecological examination (general) (routine) without abnormal findings: Secondary | ICD-10-CM | POA: Insufficient documentation

## 2022-03-10 DIAGNOSIS — Z111 Encounter for screening for respiratory tuberculosis: Secondary | ICD-10-CM | POA: Diagnosis not present

## 2022-03-10 DIAGNOSIS — L709 Acne, unspecified: Secondary | ICD-10-CM | POA: Diagnosis not present

## 2022-03-10 NOTE — Assessment & Plan Note (Signed)
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

## 2022-03-10 NOTE — Progress Notes (Signed)
Subjective:   By signing my name below, I, Kellie Simmering, attest that this documentation has been prepared under the direction and in the presence of Debbrah Alar, NP 03/10/2022.   Patient ID: Jillian Kelley, female    DOB: 08/04/2001, 21 y.o.   MRN: [id number]  Chief Complaint  Patient presents with   Gynecologic Exam    HPI Patient is in today for GYN exam/pap smear. This is her first pap and she denies any gyn concerns such as abnormal discharge. She is not sexually active and denies previous history of sexual activity.    Past Medical History:  Diagnosis Date   Acne     No past surgical history on file.  Family History  Problem Relation Age of Onset   Liver cancer Father        died at 58   Alcohol abuse Father    Breast cancer Maternal Grandmother 53    Social History   Socioeconomic History   Marital status: Single    Spouse name: Not on file   Number of children: Not on file   Years of education: Not on file   Highest education level: Not on file  Occupational History   Not on file  Tobacco Use   Smoking status: Never   Smokeless tobacco: Never  Vaping Use   Vaping Use: Never used  Substance and Sexual Activity   Alcohol use: Yes    Alcohol/week: 1.0 standard drink of alcohol    Types: 1 Shots of liquor per week    Comment: per week   Drug use: No   Sexual activity: Never    Birth control/protection: None  Other Topics Concern   Not on file  Social History Narrative   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     Social Determinants of Health   Financial Resource Strain: Not on file  Food Insecurity: No Food Insecurity (02/09/2022)   Hunger Vital Sign    Worried About Running Out of Food in the Last Year: Never true    Ran Out of Food in the Last Year: Never true  Transportation Needs: No Transportation Needs  (02/09/2022)   PRAPARE - Hydrologist (Medical): No    Lack of Transportation (Non-Medical): No  Physical Activity: Insufficiently Active (02/09/2022)   Exercise Vital Sign    Days of Exercise per Week: 2 days    Minutes of Exercise per Session: 60 min  Stress: Not on file  Social Connections: Not on file  Intimate Partner Violence: Not on file    Outpatient Medications Prior to Visit  Medication Sig Dispense Refill   clindamycin-benzoyl peroxide (BENZACLIN) gel Apply topically 2 (two) times daily. 25 g 5   No facility-administered medications prior to visit.    No Known Allergies  ROS     Objective:    Physical Exam Vitals reviewed. Exam conducted with a chaperone present.  Constitutional:      Appearance: Normal appearance. She is not ill-appearing.  HENT:     Head: Normocephalic and atraumatic.     Right Ear: External ear normal.     Left Ear: External ear normal.  Eyes:     Extraocular Movements: Extraocular movements intact.     Pupils: Pupils are equal, round, and reactive to light.  Cardiovascular:  Rate and Rhythm: Normal rate and regular rhythm.     Pulses: Normal pulses.     Heart sounds: Normal heart sounds. No murmur heard.    No gallop.  Pulmonary:     Effort: Pulmonary effort is normal. No respiratory distress.     Breath sounds: Normal breath sounds. No wheezing.  Genitourinary:    Exam position: Lithotomy position.     Pubic Area: No rash.      Labia:        Right: No rash.        Left: No rash.      Vagina: Normal.     Cervix: Normal.     Uterus: Normal.      Adnexa: Right adnexa normal and left adnexa normal.  Skin:    General: Skin is warm and dry.     Comments: Improvement in forehead acne noted  Neurological:     Mental Status: She is alert and oriented to person, place, and time.  Psychiatric:        Judgment: Judgment normal.     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       Relevant Orders   QuantiFERON-TB Gold Plus   Encounter for gynecological examination - Primary    Normal GYN exam today. Pap performed.       Relevant Orders   Cytology - PAP( )   Acne    Improved with benzamycin gel. Continue same.        No orders of the defined types were placed in this encounter.   I, Nance Pear, NP, personally preformed the services described in this documentation.  All medical record entries made by the scribe were at my direction and in my presence.  I have reviewed the chart and discharge instructions (if applicable) and agree that the record reflects my personal performance and is accurate and complete. 03/10/2022.   I,Mohammed Iqbal,acting as a Education administrator for [employer], NP.,have documented all relevant documentation on the behalf of Nance Pear, NP,as directed by  Nance Pear, NP while in the presence of Nance Pear, NP.      Nance Pear, NP

## 2022-03-10 NOTE — Assessment & Plan Note (Signed)
Normal GYN exam today. Pap performed.

## 2022-03-10 NOTE — Assessment & Plan Note (Signed)
Improved with benzamycin gel. Continue same.

## 2022-03-12 LAB — QUANTIFERON-TB GOLD PLUS
Mitogen-NIL: 9.34 IU/mL
NIL: 0.01 IU/mL
QuantiFERON-TB Gold Plus: NEGATIVE
TB1-NIL: 0 IU/mL
TB2-NIL: 0 IU/mL

## 2022-03-12 LAB — CYTOLOGY - PAP: Diagnosis: NEGATIVE

## 2022-04-16 ENCOUNTER — Encounter: Payer: Self-pay | Admitting: Family

## 2022-04-17 ENCOUNTER — Telehealth: Payer: Self-pay | Admitting: Family

## 2022-04-17 NOTE — Telephone Encounter (Signed)
Pt was sent a mychart message

## 2022-04-17 NOTE — Telephone Encounter (Signed)
Please let her know that Jillian Kelley is out of the office this week. I am going to let her decide about refilling the Tretinoin when she gets back. It will be sometime next week before she gets a call back.

## 2022-04-20 MED ORDER — TRETINOIN 0.025 % EX GEL: Freq: Every day | CUTANEOUS | 5 refills | Status: DC

## 2022-04-20 NOTE — Addendum Note (Signed)
Addended by: Debbrah Alar on: 04/20/2022 01:30 PM   Modules accepted: Orders

## 2022-06-02 ENCOUNTER — Telehealth: Payer: Commercial Managed Care - HMO | Admitting: Family

## 2022-06-09 ENCOUNTER — Telehealth (INDEPENDENT_AMBULATORY_CARE_PROVIDER_SITE_OTHER): Payer: Commercial Managed Care - HMO | Admitting: Family

## 2022-06-09 ENCOUNTER — Telehealth: Payer: Self-pay | Admitting: Family

## 2022-06-09 DIAGNOSIS — L709 Acne, unspecified: Secondary | ICD-10-CM

## 2022-06-09 DIAGNOSIS — R0789 Other chest pain: Secondary | ICD-10-CM | POA: Diagnosis not present

## 2022-06-09 DIAGNOSIS — F419 Anxiety disorder, unspecified: Secondary | ICD-10-CM | POA: Diagnosis not present

## 2022-06-09 MED ORDER — TRETINOIN 0.025 % EX CREA: TOPICAL_CREAM | Freq: Every day | CUTANEOUS | 1 refills | Status: AC

## 2022-06-09 NOTE — Assessment & Plan Note (Signed)
Stable on tretinoin gel. She prefers cream. Rx for cream was sent.

## 2022-06-09 NOTE — Assessment & Plan Note (Signed)
Uncontrolled. Has increased stressors with school, time management, finances.  I suggested that she try to get in with one of her school counselors for counseling. If symptoms worsen or fail to improve with counseling, could consider addition of medication.

## 2022-06-09 NOTE — Progress Notes (Signed)
MyChart Video Visit    Virtual Visit via Video Note   This visit type was conducted due to national recommendations for restrictions regarding the COVID-19 Pandemic (e.g. social distancing) in an effort to limit this patient's exposure and mitigate transmission in our community. This patient is at least at moderate risk for complications without adequate follow up. This format is felt to be most appropriate for this patient at this time. Physical exam was limited by quality of the video and audio technology used for the visit. CMA was able to get the patient set up on a video visit.  Patient location: Home Patient and provider in visit Provider location: Office  I discussed the limitations of evaluation and management by telemedicine and the availability of in person appointments. The patient expressed understanding and agreed to proceed.  Visit Date: 06/09/2022  Today's healthcare provider: Nance Pear, NP     Subjective:    Patient ID: Jillian Kelley, female    DOB: [birth year], 21 y.o.   MRN: [id number]  Chief Complaint  Patient presents with   Anxiety    Patient complains of anxiety symptoms described as chest pain with SOB and "hard to breath" episodes. About 3 to 4 times a week for the past 2-3 months.     HPI Patient is in today for a virtual office visit  Stress/Anxiety: She reports that she is experiencing increased chest and back pain that she believes could be related to stress. Symptoms occur about 3-4 times a week for about 15-30 minutes. Sometime  when she is relaxing, she still feels tense. She also feels SOB when these symptoms arise. She notes that this year she is starting to pay for rent and is currently living with roommates. She is also starting her senior year in which she does not feel like she has control of her time. She is currently majoring in nursing.    Acne Gel: She is requesting a cream alternative to her Clindamycin Phos-Benzoyl Perox 1-5%  Gel  Past Medical History:  Diagnosis Date   Acne     No past surgical history on file.  Family History  Problem Relation Age of Onset   Liver cancer Father        died at 43   Alcohol abuse Father    Breast cancer Maternal Grandmother 91    Social History   Socioeconomic History   Marital status: Single    Spouse name: Not on file   Number of children: Not on file   Years of education: Not on file   Highest education level: Not on file  Occupational History   Not on file  Tobacco Use   Smoking status: Never   Smokeless tobacco: Never  Vaping Use   Vaping Use: Never used  Substance and Sexual Activity   Alcohol use: Yes    Alcohol/week: 1.0 standard drink of alcohol    Types: 1 Shots of liquor per week    Comment: per week   Drug use: No   Sexual activity: Never    Birth control/protection: None  Other Topics Concern   Not on file  Social History Narrative   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     Social Determinants of Health   Financial Resource Strain: Not on file  Food Insecurity:  No Food Insecurity (02/09/2022)   Hunger Vital Sign    Worried About Running Out of Food in the Last Year: Never true    Ran Out of Food in the Last Year: Never true  Transportation Needs: No Transportation Needs (02/09/2022)   PRAPARE - Hydrologist (Medical): No    Lack of Transportation (Non-Medical): No  Physical Activity: Insufficiently Active (02/09/2022)   Exercise Vital Sign    Days of Exercise per Week: 2 days    Minutes of Exercise per Session: 60 min  Stress: Not on file  Social Connections: Not on file  Intimate Partner Violence: Not on file    Outpatient Medications Prior to Visit  Medication Sig Dispense Refill   clindamycin-benzoyl peroxide (BENZACLIN) gel Apply topically 2 (two) times daily. 25 g  5   tretinoin (RETIN-A) 0.025 % gel Apply topically at bedtime. 45 g 5   No facility-administered medications prior to visit.    No Known Allergies  ROS     Objective:    Physical Exam  There were no vitals taken for this visit. Wt Readings from Last 3 Encounters:  03/10/22 175 lb (79.4 kg)  02/09/22 176 lb (79.8 kg)  06/14/21 167 lb (75.8 kg)    Gen: Awake, alert, no acute distress Resp: Breathing is even and non-labored Psych: calm/pleasant demeanor Neuro: Alert and Oriented x 3, + facial symmetry, speech is clear.     Assessment & Plan:   Problem List Items Addressed This Visit       Unprioritized   Atypical chest pain    Really sounds like anxiety related.  However, I would like to get a baseline EKG. Will arrange a nurse visit for EKG. She understands to go to the ER if she develops severe chest pain/sob that does not improve with relaxing.       Anxiety - Primary    Uncontrolled. Has increased stressors with school, time management, finances.  I suggested that she try to get in with one of her school counselors for counseling. If symptoms worsen or fail to improve with counseling, could consider addition of medication.       Acne    Stable on tretinoin gel. She prefers cream. Rx for cream was sent.       Relevant Medications   tretinoin (RETIN-A) 0.025 % cream   Meds ordered this encounter  Medications   tretinoin (RETIN-A) 0.025 % cream    Sig: Apply topically at bedtime.    Dispense:  45 g    Refill:  1    Order Specific Question:   Supervising Provider    Answer:   Penni Homans A [[id number]]    I discussed the assessment and treatment plan with the patient. The patient was provided an opportunity to ask questions and all were answered. The patient agreed with the plan and demonstrated an understanding of the instructions.   The patient was advised to call back or seek an in-person evaluation if the symptoms worsen or if the condition fails to improve  as anticipated.  I provided 20 minutes of face-to-face time during this encounter.   I,Amber Collins,acting as a Education administrator for [employer], NP.,have documented all relevant documentation on the behalf of Nance Pear, NP,as directed by  Nance Pear, NP while in the presence of Nance Pear, NP.   Nance Pear, NP [employer] [facility] [employer] [facility] [phone] (phone) [phone] (fax)  [facility]

## 2022-06-09 NOTE — Telephone Encounter (Signed)
Patient scheduled for nurse visit ekg 05-13-22, 30 min appointment

## 2022-06-09 NOTE — Assessment & Plan Note (Signed)
Really sounds like anxiety related.  However, I would like to get a baseline EKG. Will arrange a nurse visit for EKG. She understands to go to the ER if she develops severe chest pain/sob that does not improve with relaxing.

## 2022-06-09 NOTE — Telephone Encounter (Signed)
Can you please contact pt to schedule a nurse visit for EKG- dx atypical chest pain. Please schedule her when I am in the office.

## 2022-06-12 ENCOUNTER — Ambulatory Visit: Payer: Commercial Managed Care - HMO

## 2022-06-24 ENCOUNTER — Ambulatory Visit: Payer: Commercial Managed Care - HMO

## 2022-06-24 DIAGNOSIS — R0789 Other chest pain: Secondary | ICD-10-CM

## 2022-06-24 NOTE — Progress Notes (Signed)
Pt here for EKG per order: Debbrah Alar, Np   From VV 06/09/22  of Atypical chest pain  Really sounds like anxiety related. However, I would like to get a baseline EKG.  EKG was done provider seen EKG everything was ok and Pt ok to leave.

## 2022-06-24 NOTE — Addendum Note (Signed)
Addended by: Laure Kidney on: 06/24/2022 11:34 AM   Modules accepted: Level of Service

## 2022-11-09 IMAGING — CR DG CHEST 1V
1 series · 1 of 1 positions shown · non-contrast
Comparison: March 08, 2015.

CLINICAL DATA: Positive PPD test.

EXAM:
CHEST  1 VIEW

[w chest pa]
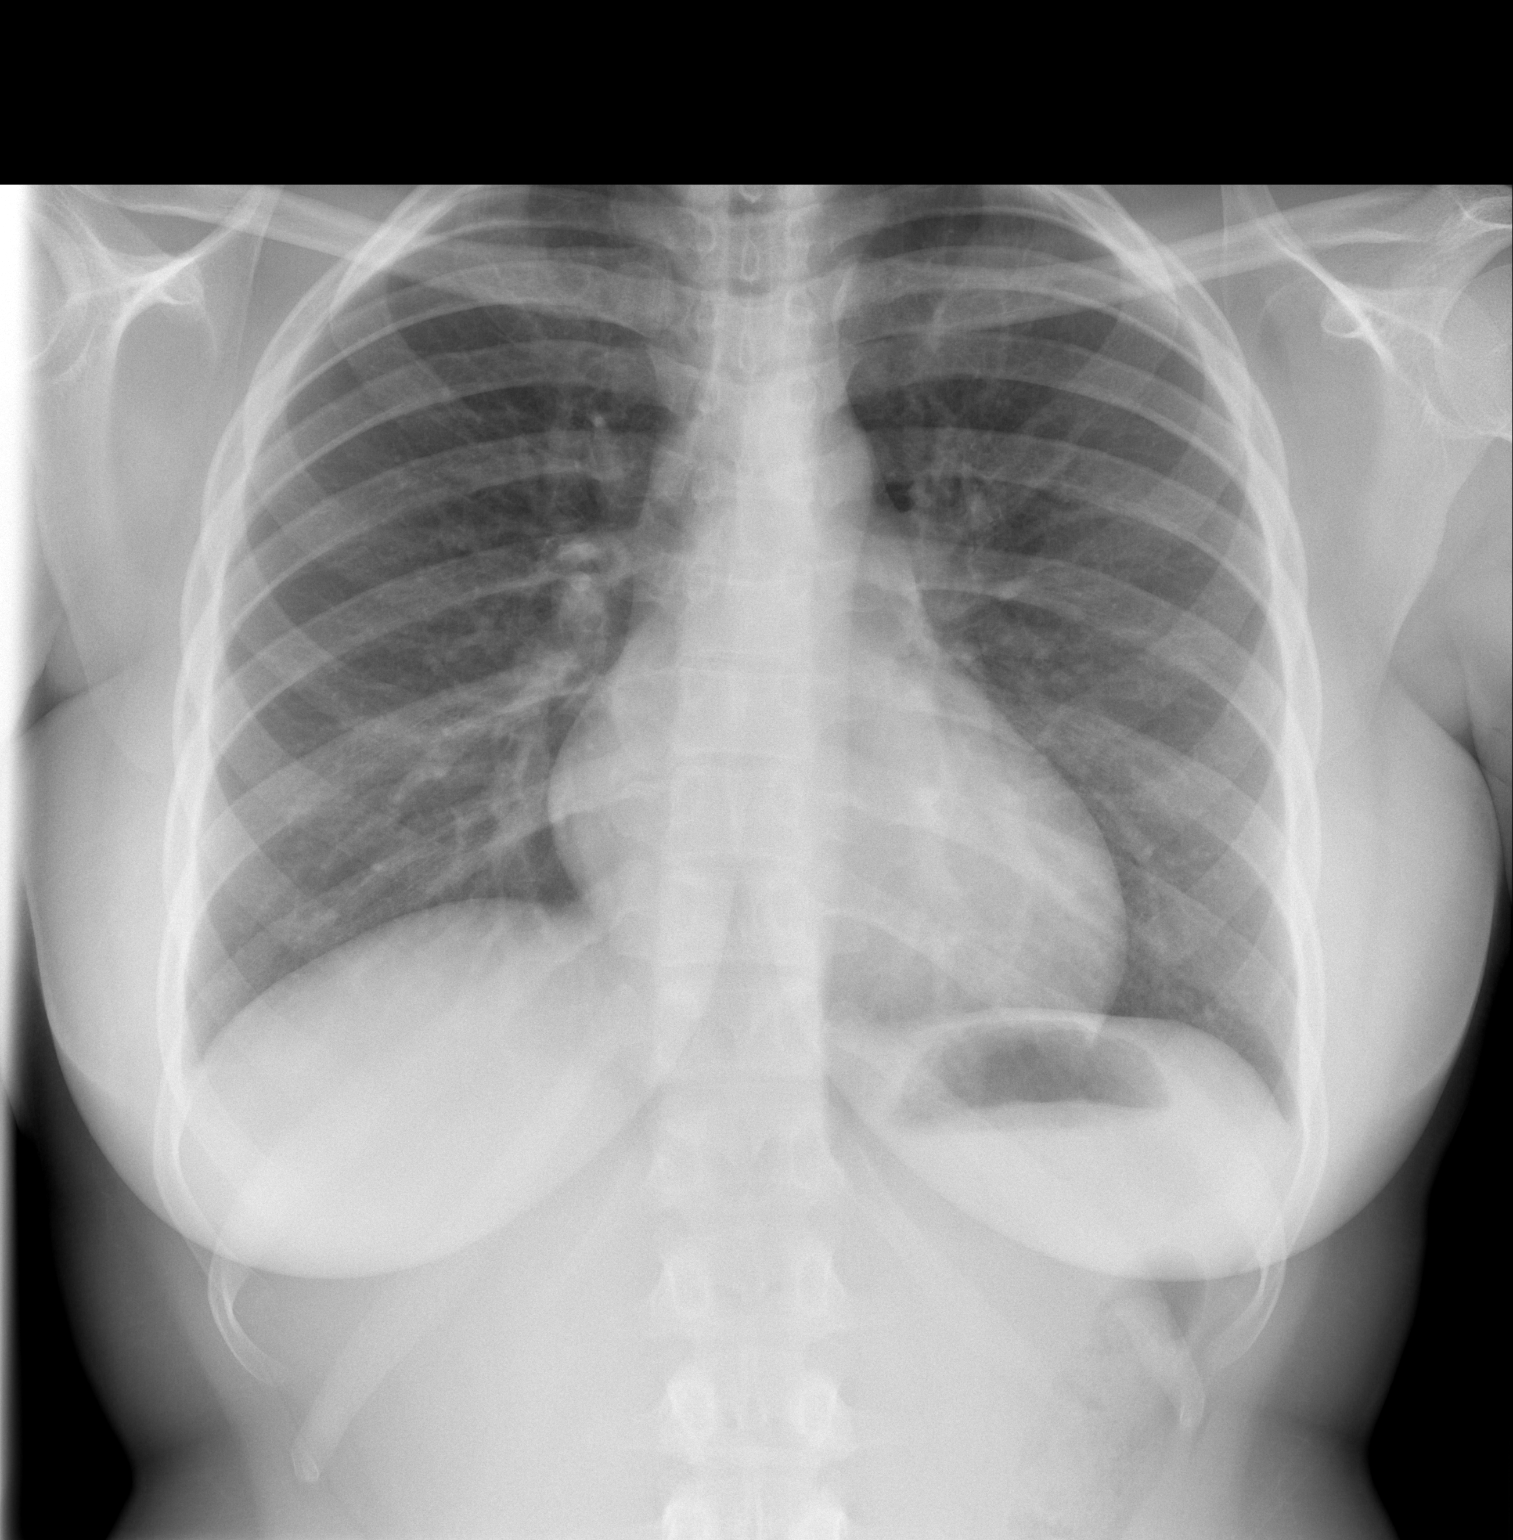

[1 of 1 positions shown; findings below may reference images not displayed]

FINDINGS: The heart size and mediastinal contours are within normal limits.
Both lungs are clear. The visualized skeletal structures are
unremarkable.
IMPRESSION: No active disease.
